# Patient Record
Sex: Female | Born: 1995 | Race: White | Hispanic: No | Marital: Married | Smoking: Never smoker
Health system: Southern US, Community
[De-identification: ages and names within clinical notes are randomized; demographics above are authoritative.]

## PROBLEM LIST (undated history)

## (undated) DIAGNOSIS — G43909 Migraine, unspecified, not intractable, without status migrainosus: Secondary | ICD-10-CM

## (undated) DIAGNOSIS — F32A Depression, unspecified: Secondary | ICD-10-CM

## (undated) HISTORY — DX: Migraine, unspecified, not intractable, without status migrainosus: G43.909

## (undated) HISTORY — DX: Depression, unspecified: F32.A

---

## 2014-04-16 ENCOUNTER — Encounter (HOSPITAL_COMMUNITY): Payer: Self-pay | Admitting: *Deleted

## 2014-04-16 ENCOUNTER — Emergency Department (HOSPITAL_COMMUNITY)
Admission: EM | Admit: 2014-04-16 | Discharge: 2014-04-17 | Disposition: A | Discharge status: Home / Self Care | Payer: 59 | Attending: Emergency Medicine | Admitting: Emergency Medicine

## 2014-04-16 ENCOUNTER — Emergency Department (HOSPITAL_COMMUNITY): Payer: 59

## 2014-04-16 DIAGNOSIS — R51 Headache: Secondary | ICD-10-CM | POA: Insufficient documentation

## 2014-04-16 DIAGNOSIS — R519 Headache, unspecified: Secondary | ICD-10-CM

## 2014-04-16 DIAGNOSIS — R509 Fever, unspecified: Secondary | ICD-10-CM | POA: Diagnosis not present

## 2014-04-16 LAB — CBC WITH DIFFERENTIAL/PLATELET
Basophils Absolute: 0 10*3/uL (ref 0.0–0.1)
Basophils Relative: 0 % (ref 0–1)
EOS ABS: 0 10*3/uL (ref 0.0–0.7)
EOS PCT: 0 % (ref 0–5)
HCT: 36.9 % (ref 36.0–46.0)
HEMOGLOBIN: 12.8 g/dL (ref 12.0–15.0)
Lymphocytes Relative: 17 % (ref 12–46)
Lymphs Abs: 0.9 10*3/uL (ref 0.7–4.0)
MCH: 30 pg (ref 26.0–34.0)
MCHC: 34.7 g/dL (ref 30.0–36.0)
MCV: 86.4 fL (ref 78.0–100.0)
MONOS PCT: 5 % (ref 3–12)
Monocytes Absolute: 0.2 10*3/uL (ref 0.1–1.0)
NEUTROS PCT: 78 % — AB (ref 43–77)
Neutro Abs: 3.9 10*3/uL (ref 1.7–7.7)
Platelets: 209 10*3/uL (ref 150–400)
RBC: 4.27 MIL/uL (ref 3.87–5.11)
RDW: 12.3 % (ref 11.5–15.5)
WBC: 5.1 10*3/uL (ref 4.0–10.5)

## 2014-04-16 LAB — COMPREHENSIVE METABOLIC PANEL
ALT: 14 U/L (ref 0–35)
ANION GAP: 9 (ref 5–15)
AST: 27 U/L (ref 0–37)
Albumin: 4 g/dL (ref 3.5–5.2)
Alkaline Phosphatase: 67 U/L (ref 39–117)
BUN: 8 mg/dL (ref 6–23)
CALCIUM: 9 mg/dL (ref 8.4–10.5)
CO2: 24 mmol/L (ref 19–32)
Chloride: 106 mmol/L (ref 96–112)
Creatinine, Ser: 0.89 mg/dL (ref 0.50–1.10)
GFR calc non Af Amer: >90 mL/min (ref >90)
GLUCOSE: 99 mg/dL (ref 70–99)
Potassium: 3.9 mmol/L (ref 3.5–5.1)
Sodium: 139 mmol/L (ref 135–145)
TOTAL PROTEIN: 7.2 g/dL (ref 6.0–8.3)
Total Bilirubin: 0.6 mg/dL (ref 0.3–1.2)

## 2014-04-16 LAB — URINALYSIS, ROUTINE W REFLEX MICROSCOPIC
Bilirubin Urine: NEGATIVE
Glucose, UA: NEGATIVE mg/dL
Hgb urine dipstick: NEGATIVE
Ketones, ur: NEGATIVE mg/dL
LEUKOCYTES UA: NEGATIVE
Nitrite: NEGATIVE
PH: 5.5 (ref 5.0–8.0)
Protein, ur: NEGATIVE mg/dL
SPECIFIC GRAVITY, URINE: 1.005 (ref 1.005–1.030)
UROBILINOGEN UA: 0.2 mg/dL (ref 0.0–1.0)

## 2014-04-16 MED ORDER — ACETAMINOPHEN 325 MG PO TABS
650.0000 mg | ORAL_TABLET | Freq: Once | ORAL | Status: AC
  Administered 2014-04-16: 650 mg via ORAL
  Filled 2014-04-16: qty 2

## 2014-04-16 MED ORDER — ONDANSETRON 4 MG PO TBDP
8.0000 mg | ORAL_TABLET | Freq: Once | ORAL | Status: AC
  Administered 2014-04-16: 8 mg via ORAL
  Filled 2014-04-16: qty 2

## 2014-04-16 MED ORDER — OXYCODONE-ACETAMINOPHEN 5-325 MG PO TABS
1.0000 | ORAL_TABLET | Freq: Once | ORAL | Status: AC
  Administered 2014-04-16: 1 via ORAL
  Filled 2014-04-16: qty 1

## 2014-04-16 NOTE — ED Provider Notes (Signed)
CSN: [id number]     Arrival date & time 04/16/14  2053 History   First MD Initiated Contact with Patient 04/16/14 2304     Chief Complaint  Patient presents with  . Headache  . Nausea     (Consider location/radiation/quality/duration/timing/severity/associated sxs/prior Treatment) HPI [name] is a 19 y.o hispanic female who presents for gradual onset of right sided headaches for the past several months that have been daily and worsening in nature. She has also had chills, fever, and fatigue since yesterday. She rates the pain 9/10 now. She denies any previous work up or neurology visit for headaches. She has been taking 4 ibuprofen for pain daily but states they are not working anymore. She denies any vision changes, no photophobia, no neck pain, no nausea, no vomiting. LMP was 10 days ago.  History reviewed. No pertinent past medical history. History reviewed. No pertinent past surgical history. History reviewed. No pertinent family history. History  Substance Use Topics  . Smoking status: Never Smoker   . Smokeless tobacco: Never Used  . Alcohol Use: No   OB History    No data available     Review of Systems  Constitutional: Positive for fever, chills and appetite change.  HENT: Negative for ear pain and sore throat.   Respiratory: Negative for cough and shortness of breath.   Gastrointestinal: Negative for abdominal pain.  Genitourinary: Negative for dysuria, frequency, hematuria, flank pain and difficulty urinating.  Musculoskeletal: Negative for back pain and neck stiffness.  Skin: Negative for rash.  Neurological: Negative for syncope.  All other systems reviewed and are negative.     Allergies  Review of patient's allergies indicates no known allergies.  Home Medications   Prior to Admission medications   Not on File   BP 91/54 mmHg  Pulse 81  Temp(Src) 100.1 F (37.8 C) (Oral)  Resp 14  Ht 5\' 7"  (1.702 m)  Wt 120 lb (54.432 kg)  BMI 18.79 kg/m2   SpO2 99%  LMP 04/04/2014 (Approximate) Physical Exam  Constitutional: She is oriented to person, place, and time. She appears well-developed and well-nourished.  HENT:  Head: Normocephalic and atraumatic.  Nose: No rhinorrhea.  Mouth/Throat: Uvula is midline, oropharynx is clear and moist and mucous membranes are normal.  Eyes: Conjunctivae and EOM are normal. Pupils are equal, round, and reactive to light.  Neck: Normal range of motion. Neck supple. No spinous process tenderness present. No rigidity. Normal range of motion present. No Brudzinski's sign and no Kernig's sign noted.  Cardiovascular: Normal rate, regular rhythm and normal heart sounds.   Pulmonary/Chest: Effort normal and breath sounds normal. She has no wheezes.  Abdominal: Soft. There is no tenderness.  Musculoskeletal: Normal range of motion.  Neurological: She is alert and oriented to person, place, and time.  Cranial nerves III-XII intact. Negative romberg. Heel to shin normal bilaterally.   Skin: Skin is warm and dry.    ED Course  Procedures (including critical care time) Labs Review Labs Reviewed  CBC WITH DIFFERENTIAL/PLATELET - Abnormal; Notable for the following:    Neutrophils Relative % 78 (*)    All other components within normal limits  COMPREHENSIVE METABOLIC PANEL  URINALYSIS, ROUTINE W REFLEX MICROSCOPIC    Imaging Review Ct Head Wo Contrast  04/17/2014   CLINICAL DATA:  Chronic headache for several months. Nausea. Initial encounter.  EXAM: CT HEAD WITHOUT CONTRAST  TECHNIQUE: Contiguous axial images were obtained from the base of the skull through the vertex without  intravenous contrast.  COMPARISON:  None.  FINDINGS: There is no evidence of acute infarction, mass lesion, or intra- or extra-axial hemorrhage on CT.  The posterior fossa, including the cerebellum, brainstem and fourth ventricle, is within normal limits. The third and lateral ventricles, and basal ganglia are unremarkable in appearance.  The cerebral hemispheres are symmetric in appearance, with normal gray-white differentiation. No mass effect or midline shift is seen.  There is no evidence of fracture; visualized osseous structures are unremarkable in appearance. The visualized portions of the orbits are within normal limits. The paranasal sinuses and mastoid air cells are well-aerated. No significant soft tissue abnormalities are seen.  IMPRESSION: Unremarkable noncontrast CT of the head.   Electronically Signed   By: Roanna Raider M.D.   On: 04/17/2014 00:24     EKG Interpretation None      MDM   Final diagnoses:  Nonintractable headache, unspecified chronicity pattern, unspecified headache type   Patient presents with new onset right sided headache for the past several months but worse since yesterday with fever and fatigue. No vision changes, no aura, no nausea or vomiting. She has not had a previous headache workup, no CT head, and neurology appointment.  Her neck exam is benign and she has full range of motion.  Negative Brudzinski and Kernig's sign.  No extremity weakness. No cranial nerve deficit.  Her labs are unremarkable.  She has a temp of 100.1. CT of her head shows no evidence of acute infarction, mass lesion, or hemorrhage. I have given her tylenol, Toradol and reglan for pain. She received a percocet in triage.   I have discussed this case with Dr. Rubin Payor who agrees with the plan to give the patient Toradol and Reglan and discharge home once symptoms improve.  1:21 I discussed labs and CT scan with the patient.  She wants to go home even though her pain is still 7/10.  I explained that I think she should stay until her pain is under control but she states she is feeling better. I also discussed neurology and pcp f/u as well as nsaid's for headache and tylenol for fever.  Return precautions also discussed such as stiff neck, rash, worsening headache.     Catha Gosselin, PA-C 04/17/14 0124  Benjiman Core, MD 04/20/14 1450

## 2014-04-16 NOTE — ED Notes (Signed)
Pt reports right sided headache for two day associated with nausea. Pt denies v/d.

## 2014-04-17 MED ORDER — KETOROLAC TROMETHAMINE 60 MG/2ML IM SOLN
30.0000 mg | Freq: Once | INTRAMUSCULAR | Status: AC
  Administered 2014-04-17: 30 mg via INTRAMUSCULAR
  Filled 2014-04-17: qty 2

## 2014-04-17 MED ORDER — METOCLOPRAMIDE HCL 10 MG PO TABS
10.0000 mg | ORAL_TABLET | Freq: Once | ORAL | Status: AC
  Administered 2014-04-17: 10 mg via ORAL
  Filled 2014-04-17: qty 1

## 2014-04-17 MED ORDER — KETOROLAC TROMETHAMINE 60 MG/2ML IM SOLN: 60.0000 mg | Freq: Once | INTRAMUSCULAR | Status: DC

## 2014-04-17 NOTE — Discharge Instructions (Signed)
General Headache Without Cause A headache is pain or discomfort felt around the head or neck area. The specific cause of a headache may not be found. There are many causes and types of headaches. A few common ones are:  Tension headaches.  Migraine headaches.  Cluster headaches.  Chronic daily headaches. HOME CARE INSTRUCTIONS   Keep all follow-up appointments with your caregiver or any specialist referral.  Only take over-the-counter or prescription medicines for pain or discomfort as directed by your caregiver.  Lie down in a dark, quiet room when you have a headache.  Keep a headache journal to find out what may trigger your migraine headaches. For example, write down:  What you eat and drink.  How much sleep you get.  Any change to your diet or medicines.  Try massage or other relaxation techniques.  Put ice packs or heat on the head and neck. Use these 3 to 4 times per day for 15 to 20 minutes each time, or as needed.  Limit stress.  Sit up straight, and do not tense your muscles.  Quit smoking if you smoke.  Limit alcohol use.  Decrease the amount of caffeine you drink, or stop drinking caffeine.  Eat and sleep on a regular schedule.  Get 7 to 9 hours of sleep, or as recommended by your caregiver.  Keep lights dim if bright lights bother you and make your headaches worse. SEEK MEDICAL CARE IF:   You have problems with the medicines you were prescribed.  Your medicines are not working.  You have a change from the usual headache.  You have nausea or vomiting. SEEK IMMEDIATE MEDICAL CARE IF:   Your headache becomes severe.  You have a fever.  You have a stiff neck.  You have loss of vision.  You have muscular weakness or loss of muscle control.  You start losing your balance or have trouble walking.  You feel faint or pass out.  You have severe symptoms that are different from your first symptoms. MAKE SURE YOU:   Understand these  instructions.  Will watch your condition.  Will get help right away if you are not doing well or get worse. Document Released: 01/12/2005 Document Revised: 04/06/2011 Document Reviewed: 01/28/2011 [facility] Patient Information 2015 [place], [place]. This information is not intended to replace advice given to you by your health care provider. Make sure you discuss any questions you have with your health care provider.  F/u with neurology and your pcp for further evaluation.  Return for worsening symptoms such as neck pain and stiffness.

## 2016-10-21 ENCOUNTER — Other Ambulatory Visit (HOSPITAL_BASED_OUTPATIENT_CLINIC_OR_DEPARTMENT_OTHER): Payer: Self-pay | Admitting: Physician Assistant

## 2016-10-21 ENCOUNTER — Ambulatory Visit (HOSPITAL_BASED_OUTPATIENT_CLINIC_OR_DEPARTMENT_OTHER)
Admission: RE | Admit: 2016-10-21 | Discharge: 2016-10-21 | Disposition: A | Discharge status: Home / Self Care | Payer: 59 | Source: Ambulatory Visit | Attending: Physician Assistant | Admitting: Physician Assistant

## 2016-10-21 DIAGNOSIS — R110 Nausea: Secondary | ICD-10-CM

## 2016-10-21 DIAGNOSIS — R1031 Right lower quadrant pain: Secondary | ICD-10-CM | POA: Diagnosis not present

## 2016-10-21 DIAGNOSIS — R109 Unspecified abdominal pain: Secondary | ICD-10-CM | POA: Insufficient documentation

## 2016-10-21 MED ORDER — IOPAMIDOL (ISOVUE-300) INJECTION 61%
100.0000 mL | Freq: Once | INTRAVENOUS | Status: AC | PRN
  Administered 2016-10-21: 100 mL via INTRAVENOUS

## 2017-12-13 DIAGNOSIS — J3089 Other allergic rhinitis: Secondary | ICD-10-CM | POA: Insufficient documentation

## 2017-12-13 DIAGNOSIS — G43709 Chronic migraine without aura, not intractable, without status migrainosus: Secondary | ICD-10-CM | POA: Insufficient documentation

## 2018-03-17 IMAGING — CT CT ABD-PELV W/ CM
2 of 4 series · 16 of 46 positions shown, 18 images · IV contrast (APPLIED)
Comparison: None.

CLINICAL DATA: Right lower quadrant abdominal pain and right flank
pain

EXAM:
CT ABDOMEN AND PELVIS WITH CONTRAST
TECHNIQUE: Multidetector CT imaging of the abdomen and pelvis was performed
using the standard protocol following bolus administration of
intravenous contrast.
CONTRAST:  100mL 3DVFL6-P44 IOPAMIDOL (3DVFL6-P44) INJECTION 61%

[Series 2: axial st · axial · 0.78mm/px · z∈[+405,+815]mm · 13 of 90 slices shown, 15 images]
[im 4/90  soft-tissue]
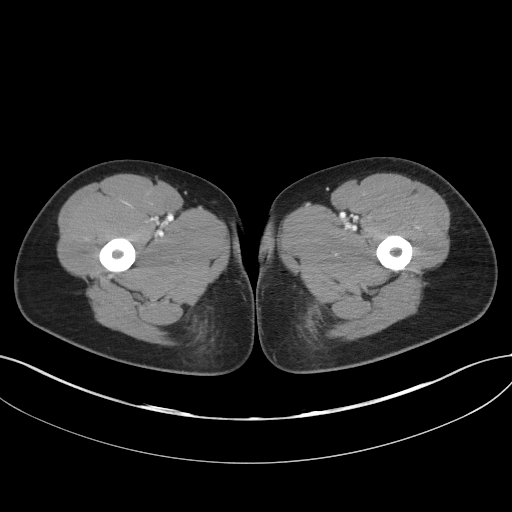
[im 4/90  bone]
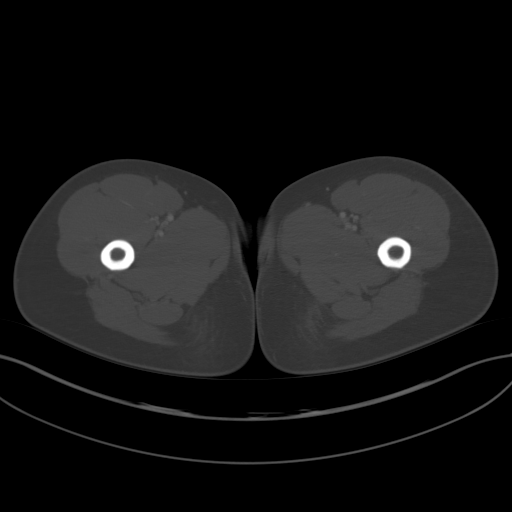
[im 11/90  soft-tissue]
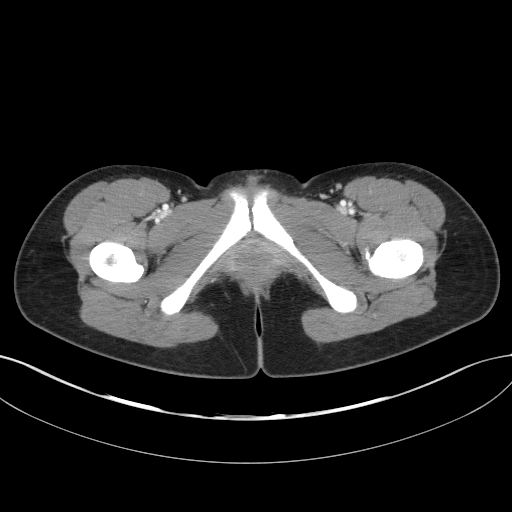
[im 18/90  soft-tissue]
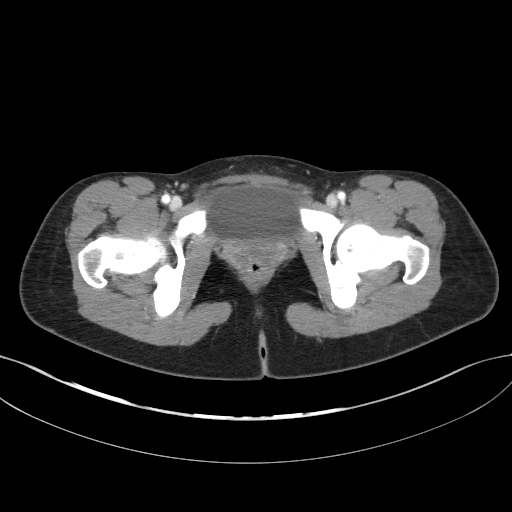
[im 25/90  soft-tissue]
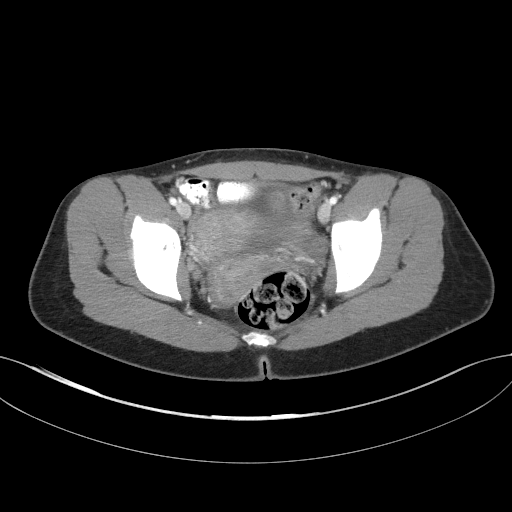
[im 33/90  soft-tissue]
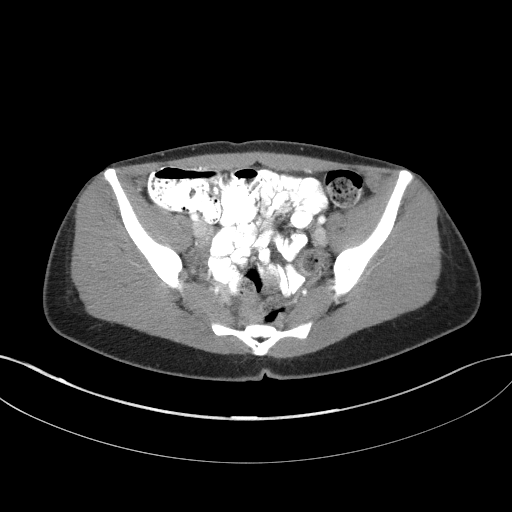
[im 40/90  soft-tissue]
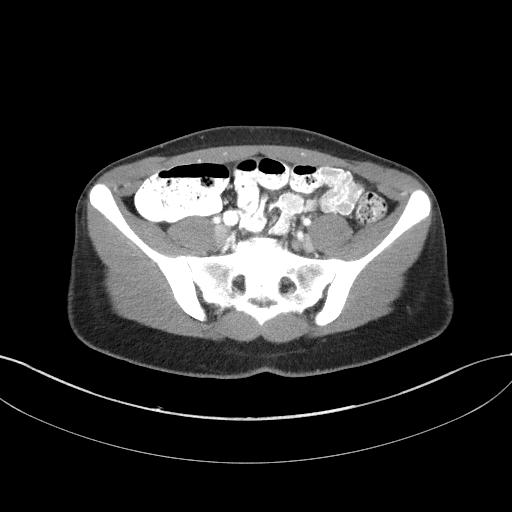
[im 47/90  soft-tissue]
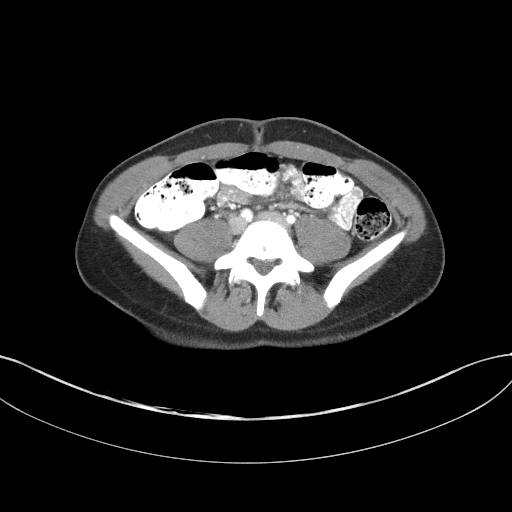
[im 50/90  soft-tissue]
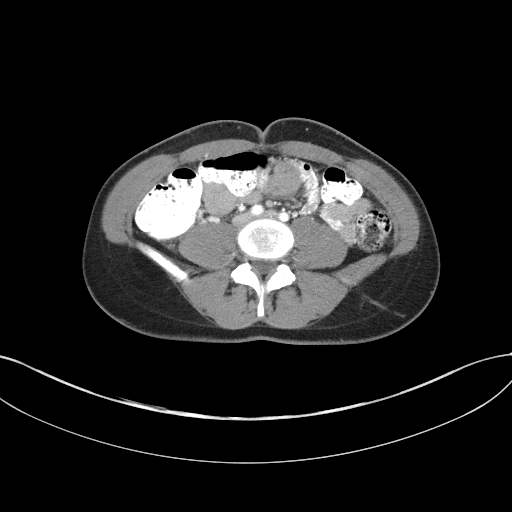
[im 57/90  soft-tissue]
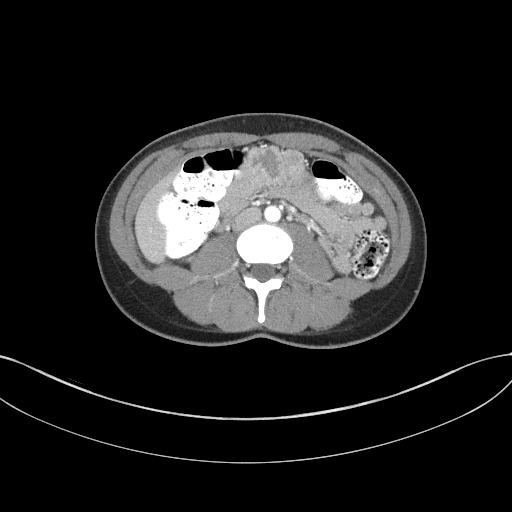
[im 57/90  bone]
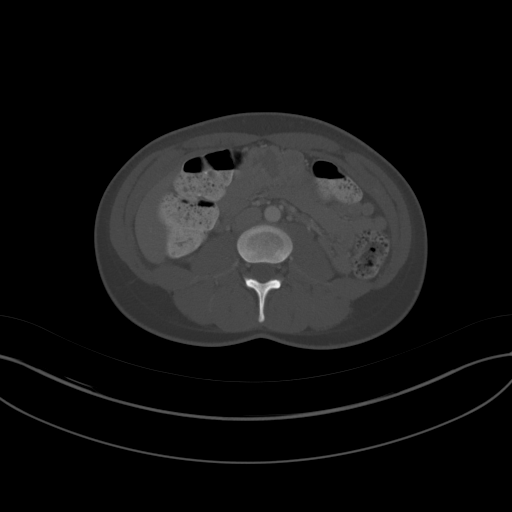
[im 65/90  soft-tissue]
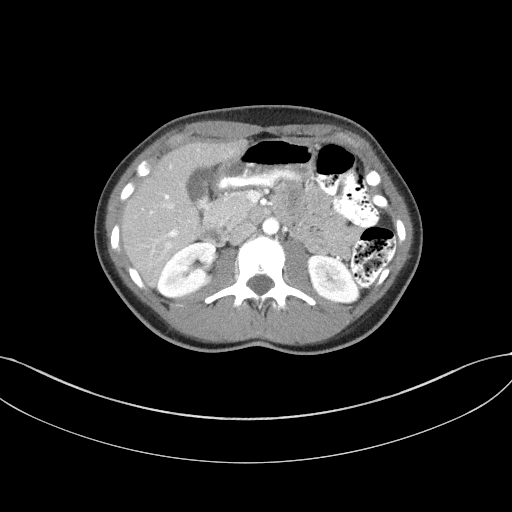
[im 72/90  soft-tissue]
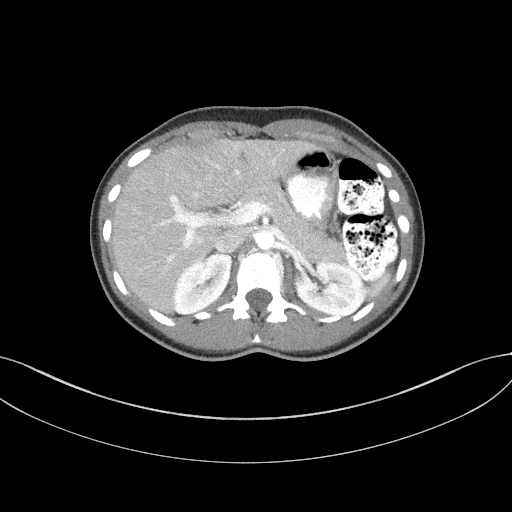
[im 79/90  soft-tissue]
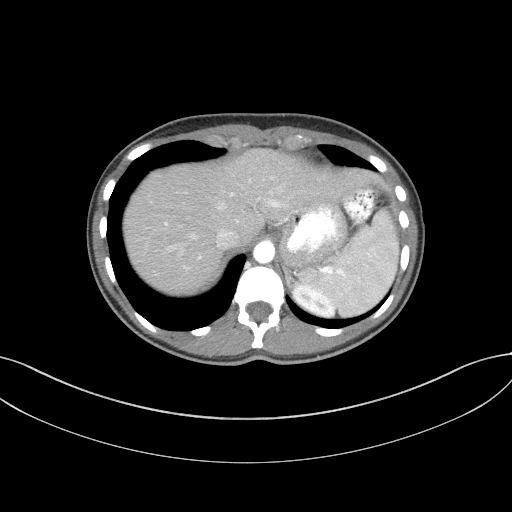
[im 86/90  soft-tissue]
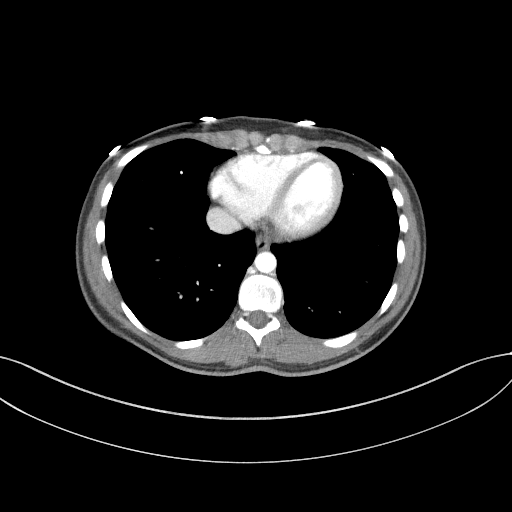

[Series 5: coronal st · coronal · 0.71mm/px · 3 of 65 slices shown]
[im 22/65  soft-tissue]
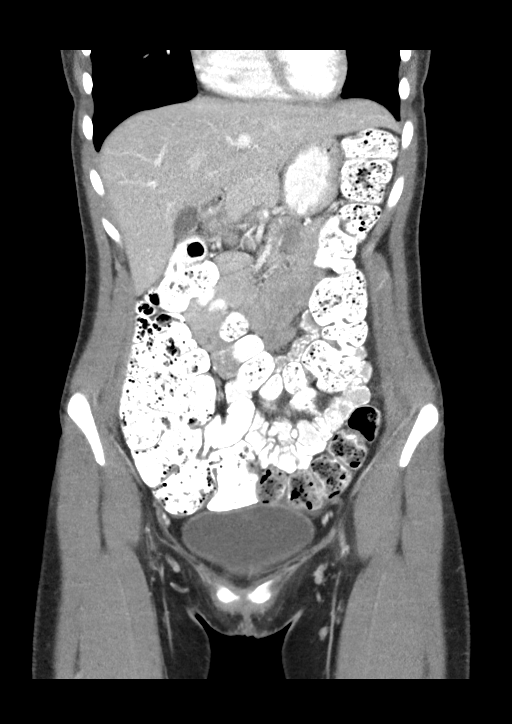
[im 29/65  soft-tissue]
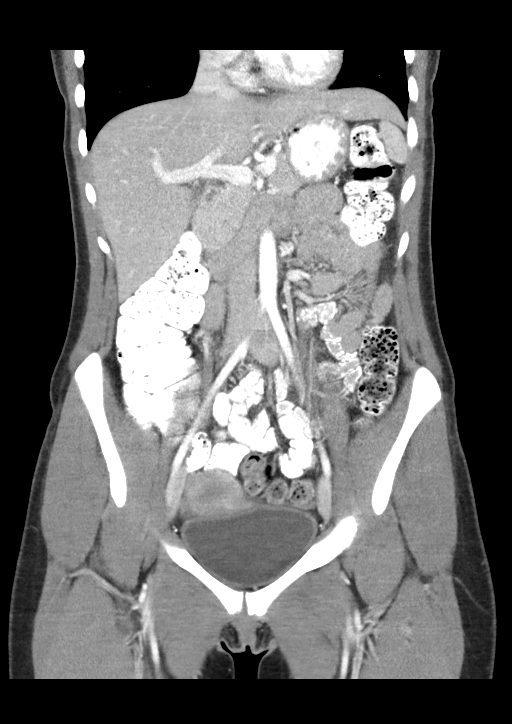
[im 36/65  soft-tissue]
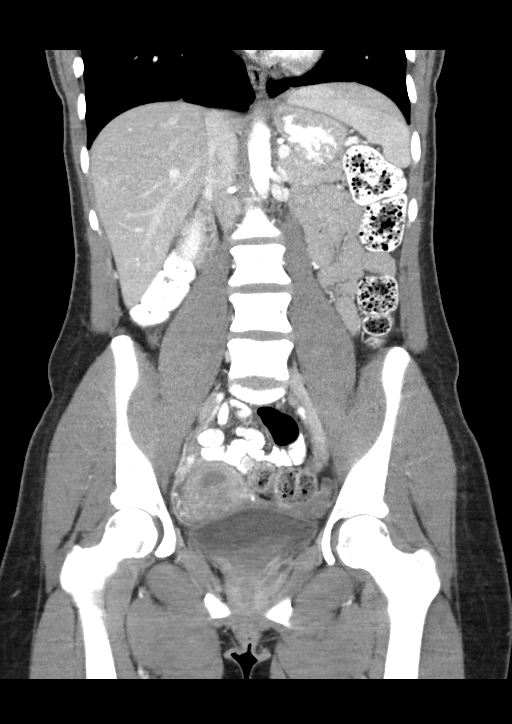

[16 of 46 positions shown; findings below may reference images not displayed]

FINDINGS: Lower chest: No acute abnormality.

Hepatobiliary: No focal liver abnormality is seen. No gallstones,
gallbladder wall thickening, or biliary dilatation.

Pancreas: Unremarkable. No pancreatic ductal dilatation or
surrounding inflammatory changes.

Spleen: Normal in size without focal abnormality.

Adrenals/Urinary Tract: Adrenal glands are unremarkable. Kidneys are
normal, without renal calculi, focal lesion, or hydronephrosis.
Bladder is unremarkable.

Stomach/Bowel: Stomach is within normal limits. Possible normal
appendix seen on the coronal images. No inflammation in the right
lower quadrant No evidence of bowel wall thickening, distention, or
inflammatory changes.

Vascular/Lymphatic: No significant vascular findings are present. No
enlarged abdominal or pelvic lymph nodes.

Reproductive: No adnexal masses. Possible enhancing fibroid in the
anterior wall of the uterus.

Other: Trace free fluid in the pelvis.  Negative for free air

Musculoskeletal: No acute or significant osseous findings.
IMPRESSION: 1. No definite CT evidence for acute appendicitis.
2. Trace free fluid in the pelvis.

## 2020-07-02 ENCOUNTER — Encounter: Payer: Self-pay | Admitting: Family

## 2020-07-02 ENCOUNTER — Ambulatory Visit (INDEPENDENT_AMBULATORY_CARE_PROVIDER_SITE_OTHER): Payer: 59 | Admitting: Family

## 2020-07-02 ENCOUNTER — Other Ambulatory Visit: Payer: Self-pay

## 2020-07-02 VITALS — BP 116/74 | HR 76 | Temp 97.8°F | Resp 16 | Ht 66.5 in | Wt 145.0 lb

## 2020-07-02 DIAGNOSIS — Z13220 Encounter for screening for lipoid disorders: Secondary | ICD-10-CM

## 2020-07-02 DIAGNOSIS — R5383 Other fatigue: Secondary | ICD-10-CM | POA: Diagnosis not present

## 2020-07-02 DIAGNOSIS — G43809 Other migraine, not intractable, without status migrainosus: Secondary | ICD-10-CM | POA: Diagnosis not present

## 2020-07-02 DIAGNOSIS — L219 Seborrheic dermatitis, unspecified: Secondary | ICD-10-CM

## 2020-07-02 LAB — CBC WITH DIFFERENTIAL/PLATELET
Basophils Absolute: 0 10*3/uL (ref 0.0–0.1)
Basophils Relative: 0.7 % (ref 0.0–3.0)
Eosinophils Absolute: 0.1 10*3/uL (ref 0.0–0.7)
Eosinophils Relative: 2.7 % (ref 0.0–5.0)
HCT: 40.1 % (ref 36.0–46.0)
Hemoglobin: 13.5 g/dL (ref 12.0–15.0)
Lymphocytes Relative: 48.7 % — ABNORMAL HIGH (ref 12.0–46.0)
Lymphs Abs: 2.7 10*3/uL (ref 0.7–4.0)
MCHC: 33.7 g/dL (ref 30.0–36.0)
MCV: 88.8 fl (ref 78.0–100.0)
Monocytes Absolute: 0.4 10*3/uL (ref 0.1–1.0)
Monocytes Relative: 7.8 % (ref 3.0–12.0)
Neutro Abs: 2.2 10*3/uL (ref 1.4–7.7)
Neutrophils Relative %: 40.1 % — ABNORMAL LOW (ref 43.0–77.0)
Platelets: 234 10*3/uL (ref 150.0–400.0)
RBC: 4.52 Mil/uL (ref 3.87–5.11)
RDW: 13.2 % (ref 11.5–15.5)
WBC: 5.5 10*3/uL (ref 4.0–10.5)

## 2020-07-02 LAB — COMPREHENSIVE METABOLIC PANEL
ALT: 28 U/L (ref 0–35)
AST: 23 U/L (ref 0–37)
Albumin: 4.6 g/dL (ref 3.5–5.2)
Alkaline Phosphatase: 68 U/L (ref 39–117)
BUN: 12 mg/dL (ref 6–23)
CO2: 24 mEq/L (ref 19–32)
Calcium: 9.9 mg/dL (ref 8.4–10.5)
Chloride: 105 mEq/L (ref 96–112)
Creatinine, Ser: 0.83 mg/dL (ref 0.40–1.20)
GFR: 97.2 mL/min (ref >60.00)
Glucose, Bld: 89 mg/dL (ref 70–99)
Potassium: 4.1 mEq/L (ref 3.5–5.1)
Sodium: 140 mEq/L (ref 135–145)
Total Bilirubin: 0.4 mg/dL (ref 0.2–1.2)
Total Protein: 7.6 g/dL (ref 6.0–8.3)

## 2020-07-02 LAB — LIPID PANEL
Cholesterol: 177 mg/dL (ref 0–200)
HDL: 56.5 mg/dL (ref >39.00)
LDL Cholesterol: 90 mg/dL (ref 0–99)
NonHDL: 120.49
Total CHOL/HDL Ratio: 3
Triglycerides: 152 mg/dL — ABNORMAL HIGH (ref 0.0–149.0)
VLDL: 30.4 mg/dL (ref 0.0–40.0)

## 2020-07-02 LAB — TSH: TSH: 1.99 u[IU]/mL (ref 0.35–4.50)

## 2020-07-02 MED ORDER — UBRELVY 50 MG PO TABS: ORAL_TABLET | ORAL | 0 refills | Status: DC

## 2020-07-02 MED ORDER — KETOROLAC TROMETHAMINE 30 MG/ML IJ SOLN
30.0000 mg | Freq: Once | INTRAMUSCULAR | Status: AC
  Administered 2020-07-02: 30 mg via INTRAMUSCULAR

## 2020-07-02 MED ORDER — KETOCONAZOLE 2 % EX SHAM: 1.0000 "application " | MEDICATED_SHAMPOO | CUTANEOUS | 0 refills | Status: DC

## 2020-07-02 NOTE — Progress Notes (Signed)
Rhonda Hunter is a 25 y.o. female with the following history as recorded in EpicCare:  Patient Active Problem List   Diagnosis Date Noted  . Chronic migraine without aura without status migrainosus, not intractable 12/13/2017  . Environmental and seasonal allergies 12/13/2017    Current Outpatient Medications  Medication Sig Dispense Refill  . [START ON 07/04/2020] ketoconazole (NIZORAL) 2 % shampoo Apply 1 application topically 2 (two) times a week. 120 mL 0  . Ubrogepant (UBRELVY) 50 MG TABS Take at onset of headache as needed; repeat in 2 hours if needed; max 2/ 24 hours 10 tablet 0   No current facility-administered medications for this visit.    Allergies: Patient has no known allergies.  Past Medical History:  Diagnosis Date  . Depression   . Migraines     No past surgical history on file.  Family History  Problem Relation Age of Onset  . Depression Father   . Diabetes Maternal Grandmother   . Hearing loss Maternal Grandmother   . Cancer Paternal Grandfather     Social History   Tobacco Use  . Smoking status: Never Smoker  . Smokeless tobacco: Never Used  Substance Use Topics  . Alcohol use: Yes    Comment: occasionally    Subjective:   Presents today as a new patient; history of migraine headaches- started at least 4-5 years ago/ may have had her first migraine in her 47s; mother has similar pattern; would like to see headache specialist; has most recently discussed with provider in 2019 and was told to take Naproxen; Does feel that she has a migraine today;   LMP- last week; does note that headaches are worse with period; Is light sensitive, noise sensitive;  Also concerned that she may have psoriais of the scalp; tried OTC Nizoral shampoo with no benefit;   Sees GYN regularly; LMP- last week/ has stopped OCPs and considering to get pregnant in the next year;      Objective:  Vitals:   07/02/20 1005  BP: 116/74  Pulse: 76  Resp: 16  Temp: 97.8  F (36.6 C)  SpO2: 98%  Weight: 145 lb (65.8 kg)  Height: 5' 6.5" (1.689 m)    General: Well developed, well nourished, in no acute distress  Skin : Warm and dry. Flaking noted at base of scalp Head: Normocephalic and atraumatic  Eyes: Sclera and conjunctiva clear; pupils round and reactive to light; extraocular movements intact  Ears: External normal; canals clear; tympanic membranes normal  Oropharynx: Pink, supple. No suspicious lesions  Neck: Supple without thyromegaly, adenopathy  Lungs: Respirations unlabored; clear to auscultation bilaterally without wheeze, rales, rhonchi  CVS exam: normal rate and regular rhythm.  Musculoskeletal: No deformities; no active joint inflammation  Extremities: No edema, cyanosis, clubbing  Vessels: Symmetric bilaterally  Neurologic: Alert and oriented; speech intact; face symmetrical; moves all extremities well; CNII-XII intact without focal deficit   Assessment:  1. Other migraine without status migrainosus, not intractable   2. Lipid screening   3. Other fatigue   4. Seborrheic dermatitis     Plan:  1. Due to pregnancy planning, will not start Topamax at this time; refer to neurologist to discuss long term treatment options; Toradol IM 30 mg given in office to try and offer immediate relief; trial of Ubrelvy for rescue medication; follow up to be determined; 2. Check lipid panel; 3. Check CBC, CMP; 4. Psoriais to be considered as well; Rx for Nizoral shampoo; may need to see dermatology;  This visit occurred during the SARS-CoV-2 public health emergency.  Safety protocols were in place, including screening questions prior to the visit, additional usage of staff PPE, and extensive cleaning of exam room while observing appropriate contact time as indicated for disinfecting solutions.     No follow-ups on file.  Orders Placed This Encounter  Procedures  . CBC with Differential/Platelet  . Comp Met (CMET)  . Lipid panel  . TSH  .  Ambulatory referral to Neurology    Referral Priority:   Routine    Referral Type:   Consultation    Referral Reason:   Specialty Services Required    Requested Specialty:   Neurology    Number of Visits Requested:   1    Requested Prescriptions   Signed Prescriptions Disp Refills  . ketoconazole (NIZORAL) 2 % shampoo 120 mL 0    Sig: Apply 1 application topically 2 (two) times a week.  Marland Kitchen Ubrogepant (UBRELVY) 50 MG TABS 10 tablet 0    Sig: Take at onset of headache as needed; repeat in 2 hours if needed; max 2/ 24 hours

## 2020-07-02 NOTE — Patient Instructions (Signed)

## 2020-07-03 ENCOUNTER — Encounter: Payer: Self-pay | Admitting: Family

## 2020-07-04 ENCOUNTER — Other Ambulatory Visit: Payer: Self-pay | Admitting: Family

## 2020-07-04 MED ORDER — SUMATRIPTAN SUCCINATE 100 MG PO TABS: 100.0000 mg | ORAL_TABLET | ORAL | 0 refills | Status: DC | PRN

## 2020-07-22 ENCOUNTER — Telehealth: Payer: Self-pay

## 2020-07-22 NOTE — Telephone Encounter (Signed)
[name] with [employer] called to follow up on Prior Auth on Ubrelvy.  She was wanted to see if PA had been received and if this was still being pursued.  Ref # [id number].  CB # [id number].

## 2020-07-23 NOTE — Telephone Encounter (Signed)
Called Cover my meds and informed Radu that representative that we decided to prescribe another medication for pt. He stated understanding and will close out that case claim.

## 2021-12-05 ENCOUNTER — Encounter: Payer: Self-pay | Admitting: Family

## 2021-12-05 ENCOUNTER — Ambulatory Visit (INDEPENDENT_AMBULATORY_CARE_PROVIDER_SITE_OTHER): Payer: 59 | Admitting: Family

## 2021-12-05 VITALS — BP 102/60 | HR 91 | Temp 98.1°F | Ht 67.0 in | Wt 166.0 lb

## 2021-12-05 DIAGNOSIS — F53 Postpartum depression: Secondary | ICD-10-CM

## 2021-12-05 DIAGNOSIS — Z0000 Encounter for general adult medical examination without abnormal findings: Secondary | ICD-10-CM

## 2021-12-05 DIAGNOSIS — Z13220 Encounter for screening for lipoid disorders: Secondary | ICD-10-CM

## 2021-12-05 DIAGNOSIS — Z0001 Encounter for general adult medical examination with abnormal findings: Secondary | ICD-10-CM

## 2021-12-05 DIAGNOSIS — Z23 Encounter for immunization: Secondary | ICD-10-CM | POA: Diagnosis not present

## 2021-12-05 LAB — CBC WITH DIFFERENTIAL/PLATELET
Basophils Absolute: 0.1 10*3/uL (ref 0.0–0.1)
Basophils Relative: 0.9 % (ref 0.0–3.0)
Eosinophils Absolute: 0.2 10*3/uL (ref 0.0–0.7)
Eosinophils Relative: 2.9 % (ref 0.0–5.0)
HCT: 37.4 % (ref 36.0–46.0)
Hemoglobin: 12.6 g/dL (ref 12.0–15.0)
Lymphocytes Relative: 39.8 % (ref 12.0–46.0)
Lymphs Abs: 2.4 10*3/uL (ref 0.7–4.0)
MCHC: 33.7 g/dL (ref 30.0–36.0)
MCV: 87.8 fl (ref 78.0–100.0)
Monocytes Absolute: 0.5 10*3/uL (ref 0.1–1.0)
Monocytes Relative: 8.1 % (ref 3.0–12.0)
Neutro Abs: 2.9 10*3/uL (ref 1.4–7.7)
Neutrophils Relative %: 48.3 % (ref 43.0–77.0)
Platelets: 274 10*3/uL (ref 150.0–400.0)
RBC: 4.26 Mil/uL (ref 3.87–5.11)
RDW: 12.9 % (ref 11.5–15.5)
WBC: 6 10*3/uL (ref 4.0–10.5)

## 2021-12-05 LAB — LIPID PANEL
Cholesterol: 183 mg/dL (ref 0–200)
HDL: 58.5 mg/dL (ref >39.00)
LDL Cholesterol: 113 mg/dL — ABNORMAL HIGH (ref 0–99)
NonHDL: 124.21
Total CHOL/HDL Ratio: 3
Triglycerides: 55 mg/dL (ref 0.0–149.0)
VLDL: 11 mg/dL (ref 0.0–40.0)

## 2021-12-05 LAB — COMPREHENSIVE METABOLIC PANEL
ALT: 11 U/L (ref 0–35)
AST: 14 U/L (ref 0–37)
Albumin: 4.4 g/dL (ref 3.5–5.2)
Alkaline Phosphatase: 100 U/L (ref 39–117)
BUN: 15 mg/dL (ref 6–23)
CO2: 27 mEq/L (ref 19–32)
Calcium: 9.3 mg/dL (ref 8.4–10.5)
Chloride: 105 mEq/L (ref 96–112)
Creatinine, Ser: 0.84 mg/dL (ref 0.40–1.20)
GFR: 94.86 mL/min (ref >60.00)
Glucose, Bld: 81 mg/dL (ref 70–99)
Potassium: 4.6 mEq/L (ref 3.5–5.1)
Sodium: 138 mEq/L (ref 135–145)
Total Bilirubin: 0.3 mg/dL (ref 0.2–1.2)
Total Protein: 7.2 g/dL (ref 6.0–8.3)

## 2021-12-05 LAB — TSH: TSH: 0.99 u[IU]/mL (ref 0.35–5.50)

## 2021-12-05 NOTE — Patient Instructions (Signed)
Please try increasing the Zoloft to 50 mg daily; let me hear from you in about 2 weeks with response;

## 2021-12-05 NOTE — Progress Notes (Signed)
Rhonda Hunter is a 26 y.o. female with the following history as recorded in EpicCare:  Patient Active Problem List   Diagnosis Date Noted   Chronic migraine without aura without status migrainosus, not intractable 12/13/2017   Environmental and seasonal allergies 12/13/2017    Current Outpatient Medications  Medication Sig Dispense Refill   sertraline (ZOLOFT) 25 MG tablet Take 25 mg by mouth daily.     No current facility-administered medications for this visit.    Allergies: Patient has no known allergies.  Past Medical History:  Diagnosis Date   Depression    Migraines     No past surgical history on file.  Family History  Problem Relation Age of Onset   Depression Father    Diabetes Maternal Grandmother    Hearing loss Maternal Grandmother    Cancer Paternal Grandfather     Social History   Tobacco Use   Smoking status: Never   Smokeless tobacco: Never  Substance Use Topics   Alcohol use: Yes    Comment: occasionally    Subjective:   Presents for yearly CPE; has had a baby since last OV; 54 month old daughter;  Is having some complications with post-partum depression; currently taking 25 mg daily;  Does feel like she has good family support; no thoughts of harming herself of baby; would be open to meeting with therapist;   Would like to get flu shot today; +breast-feeding;   Review of Systems  Constitutional: Negative.   HENT: Negative.    Eyes: Negative.   Respiratory: Negative.    Cardiovascular: Negative.   Gastrointestinal: Negative.   Genitourinary: Negative.   Musculoskeletal: Negative.   Skin: Negative.   Neurological:  Negative for headaches.  Endo/Heme/Allergies: Negative.   Psychiatric/Behavioral:  Positive for depression. Negative for suicidal ideas. The patient is nervous/anxious.      Objective:  Vitals:   12/05/21 0857  BP: 102/60  Pulse: 91  Temp: 98.1 F (36.7 C)  TempSrc: Oral  SpO2: 99%  Weight: 166 lb (75.3 kg)   Height: _0  (1.702 m)    General: Well developed, well nourished, in no acute distress  Skin : Warm and dry.  Head: Normocephalic and atraumatic  Eyes: Sclera and conjunctiva clear; pupils round and reactive to light; extraocular movements intact  Ears: External normal; canals clear; tympanic membranes normal  Oropharynx: Pink, supple. No suspicious lesions  Neck: Supple without thyromegaly, adenopathy  Lungs: Respirations unlabored; clear to auscultation bilaterally without wheeze, rales, rhonchi  CVS exam: normal rate and regular rhythm.  Abdomen: Soft; nontender; nondistended; normoactive bowel sounds; no masses or hepatosplenomegaly  Musculoskeletal: No deformities; no active joint inflammation  Extremities: No edema, cyanosis, clubbing  Vessels: Symmetric bilaterally  Neurologic: Alert and oriented; speech intact; face symmetrical; moves all extremities well; CNII-XII intact without focal deficit   Assessment:  1. PE (physical exam), annual   2. Lipid screening   3. Post partum depression   4. Need for immunization against influenza     Plan:  Age appropriate preventive healthcare needs addressed; encouraged regular eye doctor and dental exams; encouraged regular exercise; will update labs and refills as needed today; follow-up to be determined; Increase Zoloft to 50 mg- she will call back with response in 2 weeks- may need to consider 100 mg dosage; will refer to therapist with specialty in post-partum management;  Patient denies being suicidal or plans to harm baby;  Congratulated patient for being an amazing mom and encouraged her to continue to  ask for help/ remember that she is important too; do feel that increased dosage of medication will be beneficial;   No follow-ups on file.  Orders Placed This Encounter  Procedures   Flu Vaccine QUAD 19moIM (Fluarix, Fluzone & Alfiuria Quad PF)   CBC with Differential/Platelet   Comp Met (CMET)   Lipid panel   TSH   Ambulatory  referral to Psychology    Referral Priority:   Routine    Referral Type:   Psychiatric    Referral Reason:   Specialty Services Required    Requested Specialty:   Psychology    Number of Visits Requested:   1    Requested Prescriptions    No prescriptions requested or ordered in this encounter

## 2021-12-09 ENCOUNTER — Ambulatory Visit (INDEPENDENT_AMBULATORY_CARE_PROVIDER_SITE_OTHER): Payer: 59 | Admitting: Family

## 2021-12-09 ENCOUNTER — Ambulatory Visit (HOSPITAL_BASED_OUTPATIENT_CLINIC_OR_DEPARTMENT_OTHER)
Admission: RE | Admit: 2021-12-09 | Discharge: 2021-12-09 | Disposition: A | Discharge status: Home / Self Care | Payer: 59 | Source: Ambulatory Visit | Attending: Family | Admitting: Family

## 2021-12-09 ENCOUNTER — Encounter: Payer: Self-pay | Admitting: Family

## 2021-12-09 VITALS — BP 130/60 | HR 97 | Temp 98.1°F | Ht 67.0 in | Wt 168.0 lb

## 2021-12-09 DIAGNOSIS — M545 Low back pain, unspecified: Secondary | ICD-10-CM | POA: Insufficient documentation

## 2021-12-09 NOTE — Progress Notes (Signed)
  Rhonda Hunter is a 26 y.o. female with the following history as recorded in EpicCare:  Patient Active Problem List   Diagnosis Date Noted   Chronic migraine without aura without status migrainosus, not intractable 12/13/2017   Environmental and seasonal allergies 12/13/2017    Current Outpatient Medications  Medication Sig Dispense Refill   sertraline (ZOLOFT) 25 MG tablet Take 25 mg by mouth daily.     No current facility-administered medications for this visit.    Allergies: Patient has no known allergies.  Past Medical History:  Diagnosis Date   Depression    Migraines     No past surgical history on file.  Family History  Problem Relation Age of Onset   Depression Father    Diabetes Maternal Grandmother    Hearing loss Maternal Grandmother    Cancer Paternal Grandfather     Social History   Tobacco Use   Smoking status: Never   Smokeless tobacco: Never  Substance Use Topics   Alcohol use: Yes    Comment: occasionally    Subjective:   Low back pain x 2-3 days; felt back "lock up" after sitting on the floor with her daughter; some benefit with Ibuprofen; is working with Systems analyst- doing ab work/ pelvic floor therapy; wanting to get X-ray to make sure "everything was right."    Objective:  Vitals:   12/09/21 1258  BP: 130/60  Pulse: 97  Temp: 98.1 F (36.7 C)  TempSrc: Oral  SpO2: 98%  Weight: 168 lb (76.2 kg)  Height: 5\' 7"  (1.702 m)    General: Well developed, well nourished, in no acute distress  Skin : Warm and dry.  Head: Normocephalic and atraumatic  Eyes: Sclera and conjunctiva clear; pupils round and reactive to light; extraocular movements intact  Ears: External normal; canals clear; tympanic membranes normal  Oropharynx: Pink, supple. No suspicious lesions  Neck: Supple without thyromegaly, adenopathy  Lungs: Respirations unlabored;  Musculoskeletal: No deformities; no active joint inflammation  Extremities: No edema,  cyanosis, clubbing  Vessels: Symmetric bilaterally  Neurologic: Alert and oriented; speech intact; face symmetrical; moves all extremities well; CNII-XII intact without focal deficit   Assessment:  1. Acute right-sided low back pain without sciatica     Plan:  Suspect secondary to pregnancy and delivery complications; patient is already working with ; to consider having patient see sports medicine for adjustment; follow up to be determined.   No follow-ups on file.  Orders Placed This Encounter  Procedures   DG Lumbar Spine Complete    Standing Status:   Future    Standing Expiration Date:   12/10/2022    Order Specific Question:   Reason for Exam (SYMPTOM  OR DIAGNOSIS REQUIRED)    Answer:   low back pain    Order Specific Question:   Is patient pregnant?    Answer:   No    Order Specific Question:   Preferred imaging location?    Answer:   12/12/2022    Requested Prescriptions    No prescriptions requested or ordered in this encounter

## 2021-12-12 ENCOUNTER — Telehealth: Payer: Self-pay | Admitting: Family

## 2021-12-12 NOTE — Telephone Encounter (Signed)
Called Pt and relayed results. Pt understood and stated she will wait a week to see how she's feeling and give the office a call back.

## 2021-12-12 NOTE — Telephone Encounter (Signed)
Pt called to go over imagaing results. All CMAs were unavailable at the time and advised a note would be sent back to call her back later. Pt acknowledged understanding.

## 2021-12-17 ENCOUNTER — Encounter: Payer: Self-pay | Admitting: Family

## 2021-12-22 ENCOUNTER — Other Ambulatory Visit: Payer: Self-pay | Admitting: Family

## 2021-12-22 MED ORDER — SERTRALINE HCL 50 MG PO TABS: 50.0000 mg | ORAL_TABLET | Freq: Every day | ORAL | 0 refills | Status: DC

## 2022-01-06 ENCOUNTER — Other Ambulatory Visit: Payer: Self-pay | Admitting: Family

## 2022-01-06 MED ORDER — SERTRALINE HCL 50 MG PO TABS: 50.0000 mg | ORAL_TABLET | Freq: Every day | ORAL | 1 refills | Status: DC

## 2022-02-06 ENCOUNTER — Encounter: Payer: Self-pay | Admitting: Family

## 2022-02-10 ENCOUNTER — Ambulatory Visit: Payer: Managed Care, Other (non HMO) | Admitting: Family

## 2022-02-10 ENCOUNTER — Encounter: Payer: Self-pay | Admitting: Family

## 2022-02-10 VITALS — BP 116/62 | HR 104 | Temp 98.0°F | Ht 67.0 in | Wt 171.8 lb

## 2022-02-10 DIAGNOSIS — L509 Urticaria, unspecified: Secondary | ICD-10-CM

## 2022-02-10 NOTE — Patient Instructions (Addendum)
Switch to [employer] as opposed to [place];   Can try OTC Sarna for instant relief of the itching;  I appreciate you don't want to use antihistamines since you are breastfeeding. Hopefully these changes will help. Please see the dermatologist as discussed today.

## 2022-02-10 NOTE — Progress Notes (Signed)
  Rhonda Hunter is a 27 y.o. female with the following history as recorded in [employer]:  Patient Active Problem List   Diagnosis Date Noted   Chronic migraine without aura without status migrainosus, not intractable 12/13/2017   Environmental and seasonal allergies 12/13/2017    Current Outpatient Medications  Medication Sig Dispense Refill   sertraline (ZOLOFT) 50 MG tablet Take 1 tablet (50 mg total) by mouth daily. 90 tablet 1   No current facility-administered medications for this visit.    Allergies: Patient has no known allergies.  Past Medical History:  Diagnosis Date   Depression    Migraines     No past surgical history on file.  Family History  Problem Relation Age of Onset   Depression Father    Diabetes Maternal Grandmother    Hearing loss Maternal Grandmother    Cancer Paternal Grandfather     Social History   Tobacco Use   Smoking status: Never   Smokeless tobacco: Never  Substance Use Topics   Alcohol use: Yes    Comment: occasionally    Subjective:   Had sent message recently about intermittent rash- "different spots at different times"; denies any new soaps, foods, detergents or medications.  Is still breast- feeding; would prefer not to take any oral medications;  Had normal labs done at end of November 2023;   Objective:  Vitals:   02/10/22 1400  BP: 116/62  Pulse: (!) 104  Temp: 98 F (36.7 C)  TempSrc: Oral  SpO2: 97%  Weight: 171 lb 12.8 oz (77.9 kg)  Height: 5\' 7"  (1.702 m)    General: Well developed, well nourished, in no acute distress  Skin : Warm and dry. Erythema noted on right side of neck at onset of OV- had improved by end;  Head: Normocephalic and atraumatic  Lungs: Respirations unlabored; Neurologic: Alert and oriented; speech intact; face symmetrical; moves all extremities well; CNII-XII intact without focal deficit   Assessment:  1. Urticaria     Plan:  Understandably patient does not want to take  antihistamines due to concens for affecting her milk supply; will have her stop using [place] soap and change to [place] for sensitive skin; can try OTC S[place] for instant relief; will refer to dermatology for second opinion.   No follow-ups on file.  Orders Placed This Encounter  Procedures   Ambulatory referral to Dermatology    Referral Priority:   Routine    Referral Type:   Consultation    Referral Reason:   Specialty Services Required    Requested Specialty:   Dermatology    Number of Visits Requested:   1    Requested Prescriptions    No prescriptions requested or ordered in this encounter

## 2022-07-19 ENCOUNTER — Other Ambulatory Visit: Payer: Self-pay | Admitting: Family

## 2022-11-05 ENCOUNTER — Telehealth: Payer: Managed Care, Other (non HMO) | Admitting: Family

## 2022-11-05 ENCOUNTER — Encounter: Payer: Self-pay | Admitting: Family

## 2022-11-05 VITALS — Ht 67.0 in

## 2022-11-05 DIAGNOSIS — J019 Acute sinusitis, unspecified: Secondary | ICD-10-CM

## 2022-11-05 MED ORDER — AMOXICILLIN 875 MG PO TABS
875.0000 mg | ORAL_TABLET | Freq: Two times a day (BID) | ORAL | 0 refills | Status: AC
Start: 2022-11-05 — End: 2022-11-15

## 2022-11-05 NOTE — Progress Notes (Signed)
  Rhonda Hunter is a 27 y.o. female with the following history as recorded in [employer]:  Patient Active Problem List   Diagnosis Date Noted   Chronic migraine without aura without status migrainosus, not intractable 12/13/2017   Environmental and seasonal allergies 12/13/2017    Current Outpatient Medications  Medication Sig Dispense Refill   amoxicillin (AMOXIL) 875 MG tablet Take 1 tablet (875 mg total) by mouth 2 (two) times daily for 10 days. 20 tablet 0   sertraline (ZOLOFT) 50 MG tablet TAKE 1 TABLET(50 MG) BY MOUTH DAILY 90 tablet 1   No current facility-administered medications for this visit.    Allergies: Patient has no known allergies.  Past Medical History:  Diagnosis Date   Depression    Migraines     No past surgical history on file.  Family History  Problem Relation Age of Onset   Depression Father    Diabetes Maternal Grandmother    Hearing loss Maternal Grandmother    Cancer Paternal Grandfather     Social History   Tobacco Use   Smoking status: Never   Smokeless tobacco: Never  Substance Use Topics   Alcohol use: Yes    Comment: occasionally    Subjective:    I connected with Rhonda Hunter on 11/05/22 at  2:00 PM EDT by a video enabled telemedicine application and verified that I am speaking with the correct person using two identifiers.   I discussed the limitations of evaluation and management by telemedicine and the availability of in person appointments. The patient expressed understanding and agreed to proceed. Provider in office/ patient is at home; provider and patient are only 2 people on video call.   Sinus pain/ pressure x 1 week; [redacted] weeks pregnant; using OTC saline/ Nettie Pot/ Flonase; no fever, no shortness of breath;    Objective:  Vitals:   11/05/22 1356  Height: 5\' 7"  (1.702 m)    General: Well developed, well nourished, in no acute distress  Skin : Warm and dry.  Head: Normocephalic and atraumatic  Lungs:  Respirations unlabored;  Neurologic: Alert and oriented; speech intact; face symmetrical;   Assessment:  1. Acute sinusitis, recurrence not specified, unspecified location     Plan:  Rx for Amoxicillin 875 mg bid x 10 days; continue Flonase/ saline rinse; can use Tylenol for pain relief; follow up in person if symptoms persist.   No follow-ups on file.  No orders of the defined types were placed in this encounter.   Requested Prescriptions   Signed Prescriptions Disp Refills   amoxicillin (AMOXIL) 875 MG tablet 20 tablet 0    Sig: Take 1 tablet (875 mg total) by mouth 2 (two) times daily for 10 days.
